# Patient Record
Sex: Female | Born: 1935 | Race: White | Hispanic: No | Marital: Married | Smoking: Never smoker
Health system: Southern US, Community
[De-identification: ages and names within clinical notes are randomized; demographics above are authoritative.]

## PROBLEM LIST (undated history)

## (undated) DIAGNOSIS — D649 Anemia, unspecified: Secondary | ICD-10-CM

## (undated) DIAGNOSIS — I8390 Asymptomatic varicose veins of unspecified lower extremity: Secondary | ICD-10-CM

## (undated) DIAGNOSIS — G629 Polyneuropathy, unspecified: Secondary | ICD-10-CM

## (undated) DIAGNOSIS — M1990 Unspecified osteoarthritis, unspecified site: Secondary | ICD-10-CM

## (undated) DIAGNOSIS — R519 Headache, unspecified: Secondary | ICD-10-CM

## (undated) DIAGNOSIS — E119 Type 2 diabetes mellitus without complications: Secondary | ICD-10-CM

## (undated) DIAGNOSIS — R51 Headache: Secondary | ICD-10-CM

## (undated) DIAGNOSIS — I10 Essential (primary) hypertension: Secondary | ICD-10-CM

## (undated) HISTORY — PX: TONSILLECTOMY: SUR1361

## (undated) HISTORY — PX: TYMPANOPLASTY: SHX33

## (undated) HISTORY — PX: ABDOMINAL HYSTERECTOMY: SHX81

## (undated) HISTORY — PX: EYE SURGERY: SHX253

---

## 2008-01-08 ENCOUNTER — Encounter: Admission: RE | Admit: 2008-01-08 | Discharge: 2008-01-08 | Payer: Self-pay | Admitting: Specialist

## 2008-09-27 ENCOUNTER — Ambulatory Visit: Payer: Self-pay | Admitting: Diagnostic Radiology

## 2008-09-27 ENCOUNTER — Ambulatory Visit: Admission: RE | Admit: 2008-09-27 | Discharge: 2008-09-27 | Payer: Self-pay | Admitting: Family Medicine

## 2009-10-18 ENCOUNTER — Emergency Department (HOSPITAL_BASED_OUTPATIENT_CLINIC_OR_DEPARTMENT_OTHER): Admission: EM | Admit: 2009-10-18 | Discharge: 2009-10-18 | Payer: Self-pay | Admitting: Emergency Medicine

## 2009-10-18 ENCOUNTER — Ambulatory Visit: Payer: Self-pay | Admitting: Radiology

## 2010-08-12 LAB — URINE CULTURE: Culture: NO GROWTH

## 2010-08-12 LAB — COMPREHENSIVE METABOLIC PANEL
AST: 85 U/L — ABNORMAL HIGH (ref 0–37)
Albumin: 3.7 g/dL (ref 3.5–5.2)
Alkaline Phosphatase: 84 U/L (ref 39–117)
BUN: 37 mg/dL — ABNORMAL HIGH (ref 6–23)
Chloride: 106 mEq/L (ref 96–112)
Creatinine, Ser: 1.8 mg/dL — ABNORMAL HIGH (ref 0.4–1.2)
Glucose, Bld: 196 mg/dL — ABNORMAL HIGH (ref 70–99)
Potassium: 4.2 mEq/L (ref 3.5–5.1)

## 2010-08-12 LAB — CBC
Hemoglobin: 9.2 g/dL — ABNORMAL LOW (ref 12.0–15.0)
MCHC: 34.5 g/dL (ref 30.0–36.0)
Platelets: 131 10*3/uL — ABNORMAL LOW (ref 150–400)
WBC: 2.1 10*3/uL — ABNORMAL LOW (ref 4.0–10.5)

## 2010-08-12 LAB — POCT CARDIAC MARKERS
CKMB, poc: <1 ng/mL — ABNORMAL LOW (ref 1.0–8.0)
Myoglobin, poc: 88.9 ng/mL (ref 12–200)
Troponin i, poc: <0.05 ng/mL (ref 0.00–0.09)

## 2010-08-12 LAB — DIFFERENTIAL
Basophils Absolute: 0 10*3/uL (ref 0.0–0.1)
Basophils Relative: 1 % (ref 0–1)
Eosinophils Relative: 1 % (ref 0–5)
Lymphocytes Relative: 33 % (ref 12–46)

## 2010-08-12 LAB — URINALYSIS, ROUTINE W REFLEX MICROSCOPIC
Bilirubin Urine: NEGATIVE
Glucose, UA: NEGATIVE mg/dL
Ketones, ur: NEGATIVE mg/dL
Specific Gravity, Urine: 1.015 (ref 1.005–1.030)
pH: 5 (ref 5.0–8.0)

## 2015-03-01 ENCOUNTER — Encounter (HOSPITAL_BASED_OUTPATIENT_CLINIC_OR_DEPARTMENT_OTHER): Payer: Self-pay | Admitting: *Deleted

## 2015-03-01 ENCOUNTER — Other Ambulatory Visit: Payer: Self-pay | Admitting: Orthopedic Surgery

## 2015-03-02 ENCOUNTER — Encounter (HOSPITAL_BASED_OUTPATIENT_CLINIC_OR_DEPARTMENT_OTHER)
Admission: RE | Admit: 2015-03-02 | Discharge: 2015-03-02 | Disposition: A | Discharge status: Home / Self Care | Payer: Medicare Other | Source: Ambulatory Visit | Attending: Orthopedic Surgery | Admitting: Orthopedic Surgery

## 2015-03-02 ENCOUNTER — Other Ambulatory Visit: Payer: Self-pay

## 2015-03-02 DIAGNOSIS — Z01818 Encounter for other preprocedural examination: Secondary | ICD-10-CM | POA: Insufficient documentation

## 2015-03-02 DIAGNOSIS — Z794 Long term (current) use of insulin: Secondary | ICD-10-CM | POA: Diagnosis not present

## 2015-03-02 DIAGNOSIS — E1140 Type 2 diabetes mellitus with diabetic neuropathy, unspecified: Secondary | ICD-10-CM | POA: Diagnosis not present

## 2015-03-02 DIAGNOSIS — M720 Palmar fascial fibromatosis [Dupuytren]: Secondary | ICD-10-CM | POA: Insufficient documentation

## 2015-03-02 DIAGNOSIS — Z79899 Other long term (current) drug therapy: Secondary | ICD-10-CM | POA: Diagnosis not present

## 2015-03-02 DIAGNOSIS — Z7984 Long term (current) use of oral hypoglycemic drugs: Secondary | ICD-10-CM | POA: Diagnosis not present

## 2015-03-02 DIAGNOSIS — M1990 Unspecified osteoarthritis, unspecified site: Secondary | ICD-10-CM | POA: Diagnosis not present

## 2015-03-02 DIAGNOSIS — I10 Essential (primary) hypertension: Secondary | ICD-10-CM | POA: Diagnosis not present

## 2015-03-02 LAB — CBC
HEMATOCRIT: 32.4 % — AB (ref 36.0–46.0)
HEMOGLOBIN: 10.7 g/dL — AB (ref 12.0–15.0)
MCH: 29.3 pg (ref 26.0–34.0)
MCHC: 33 g/dL (ref 30.0–36.0)
MCV: 88.8 fL (ref 78.0–100.0)
Platelets: 199 10*3/uL (ref 150–400)
RBC: 3.65 MIL/uL — ABNORMAL LOW (ref 3.87–5.11)
RDW: 13.6 % (ref 11.5–15.5)
WBC: 7 10*3/uL (ref 4.0–10.5)

## 2015-03-02 LAB — BASIC METABOLIC PANEL
ANION GAP: 8 (ref 5–15)
BUN: 25 mg/dL — AB (ref 6–20)
CHLORIDE: 105 mmol/L (ref 101–111)
CO2: 26 mmol/L (ref 22–32)
Calcium: 9.5 mg/dL (ref 8.9–10.3)
Creatinine, Ser: 1.23 mg/dL — ABNORMAL HIGH (ref 0.44–1.00)
GFR calc Af Amer: 45 mL/min — ABNORMAL LOW (ref >60)
GFR calc non Af Amer: 39 mL/min — ABNORMAL LOW (ref >60)
GLUCOSE: 192 mg/dL — AB (ref 65–99)
POTASSIUM: 3.7 mmol/L (ref 3.5–5.1)
SODIUM: 139 mmol/L (ref 135–145)

## 2015-03-02 NOTE — Progress Notes (Signed)
Dr Okey Dupre anesthesia consult   Clear for surgery.  Pt ankles have been swelling lt more than right,  Hx of sob for months no change. Pt will see primary care for swelling after surgery

## 2015-03-02 NOTE — Progress Notes (Signed)
BP 146/69  On Pat visit

## 2015-03-08 ENCOUNTER — Ambulatory Visit (HOSPITAL_BASED_OUTPATIENT_CLINIC_OR_DEPARTMENT_OTHER): Payer: Medicare Other | Admitting: Anesthesiology

## 2015-03-08 ENCOUNTER — Encounter (HOSPITAL_BASED_OUTPATIENT_CLINIC_OR_DEPARTMENT_OTHER)
Admission: RE | Disposition: A | Discharge status: Home / Self Care | Payer: Self-pay | Source: Ambulatory Visit | Attending: Orthopedic Surgery

## 2015-03-08 ENCOUNTER — Ambulatory Visit (HOSPITAL_BASED_OUTPATIENT_CLINIC_OR_DEPARTMENT_OTHER)
Admission: RE | Admit: 2015-03-08 | Discharge: 2015-03-08 | Disposition: A | Discharge status: Home / Self Care | Payer: Medicare Other | Source: Ambulatory Visit | Attending: Orthopedic Surgery | Admitting: Orthopedic Surgery

## 2015-03-08 ENCOUNTER — Encounter (HOSPITAL_BASED_OUTPATIENT_CLINIC_OR_DEPARTMENT_OTHER): Payer: Self-pay | Admitting: Anesthesiology

## 2015-03-08 DIAGNOSIS — Z79899 Other long term (current) drug therapy: Secondary | ICD-10-CM | POA: Insufficient documentation

## 2015-03-08 DIAGNOSIS — M720 Palmar fascial fibromatosis [Dupuytren]: Secondary | ICD-10-CM | POA: Diagnosis not present

## 2015-03-08 DIAGNOSIS — I10 Essential (primary) hypertension: Secondary | ICD-10-CM | POA: Insufficient documentation

## 2015-03-08 DIAGNOSIS — M1990 Unspecified osteoarthritis, unspecified site: Secondary | ICD-10-CM | POA: Insufficient documentation

## 2015-03-08 DIAGNOSIS — E1140 Type 2 diabetes mellitus with diabetic neuropathy, unspecified: Secondary | ICD-10-CM | POA: Diagnosis not present

## 2015-03-08 DIAGNOSIS — Z7984 Long term (current) use of oral hypoglycemic drugs: Secondary | ICD-10-CM | POA: Insufficient documentation

## 2015-03-08 DIAGNOSIS — Z794 Long term (current) use of insulin: Secondary | ICD-10-CM | POA: Insufficient documentation

## 2015-03-08 HISTORY — DX: Essential (primary) hypertension: I10

## 2015-03-08 HISTORY — DX: Type 2 diabetes mellitus without complications: E11.9

## 2015-03-08 HISTORY — PX: FASCIOTOMY: SHX132

## 2015-03-08 HISTORY — DX: Unspecified osteoarthritis, unspecified site: M19.90

## 2015-03-08 HISTORY — DX: Polyneuropathy, unspecified: G62.9

## 2015-03-08 HISTORY — DX: Headache, unspecified: R51.9

## 2015-03-08 HISTORY — DX: Asymptomatic varicose veins of unspecified lower extremity: I83.90

## 2015-03-08 HISTORY — DX: Anemia, unspecified: D64.9

## 2015-03-08 HISTORY — DX: Headache: R51

## 2015-03-08 LAB — GLUCOSE, CAPILLARY
GLUCOSE-CAPILLARY: 130 mg/dL — AB (ref 65–99)
Glucose-Capillary: 130 mg/dL — ABNORMAL HIGH (ref 65–99)

## 2015-03-08 SURGERY — FASCIOTOMY, UPPER EXTREMITY
Anesthesia: Monitor Anesthesia Care | Site: Hand | Laterality: Left

## 2015-03-08 MED ORDER — CEFAZOLIN SODIUM-DEXTROSE 2-3 GM-% IV SOLR
INTRAVENOUS | Status: AC
  Filled 2015-03-08: qty 50

## 2015-03-08 MED ORDER — PROPOFOL 10 MG/ML IV BOLUS
INTRAVENOUS | Status: DC | PRN
  Administered 2015-03-08 (×2): 20 mg via INTRAVENOUS
  Administered 2015-03-08: 10 mg via INTRAVENOUS

## 2015-03-08 MED ORDER — SCOPOLAMINE 1 MG/3DAYS TD PT72: 1.0000 | MEDICATED_PATCH | Freq: Once | TRANSDERMAL | Status: DC | PRN

## 2015-03-08 MED ORDER — BUPIVACAINE HCL (PF) 0.25 % IJ SOLN
INTRAMUSCULAR | Status: DC | PRN
  Administered 2015-03-08: 8 mL

## 2015-03-08 MED ORDER — OXYCODONE HCL 5 MG PO TABS: 5.0000 mg | ORAL_TABLET | Freq: Once | ORAL | Status: DC | PRN

## 2015-03-08 MED ORDER — HYDROCODONE-ACETAMINOPHEN 5-325 MG PO TABS: 1.0000 | ORAL_TABLET | Freq: Four times a day (QID) | ORAL | Status: DC | PRN

## 2015-03-08 MED ORDER — BUPIVACAINE HCL (PF) 0.25 % IJ SOLN
INTRAMUSCULAR | Status: AC
  Filled 2015-03-08: qty 180

## 2015-03-08 MED ORDER — FENTANYL CITRATE (PF) 100 MCG/2ML IJ SOLN: 25.0000 ug | INTRAMUSCULAR | Status: DC | PRN

## 2015-03-08 MED ORDER — ONDANSETRON HCL 4 MG/2ML IJ SOLN
INTRAMUSCULAR | Status: DC | PRN
  Administered 2015-03-08: 4 mg via INTRAVENOUS

## 2015-03-08 MED ORDER — FENTANYL CITRATE (PF) 100 MCG/2ML IJ SOLN
INTRAMUSCULAR | Status: AC
  Filled 2015-03-08: qty 4

## 2015-03-08 MED ORDER — CEFAZOLIN SODIUM-DEXTROSE 2-3 GM-% IV SOLR: 2.0000 g | INTRAVENOUS | Status: DC

## 2015-03-08 MED ORDER — CHLORHEXIDINE GLUCONATE 4 % EX LIQD: 60.0000 mL | Freq: Once | CUTANEOUS | Status: DC

## 2015-03-08 MED ORDER — FENTANYL CITRATE (PF) 100 MCG/2ML IJ SOLN
INTRAMUSCULAR | Status: DC | PRN
  Administered 2015-03-08: 100 ug via INTRAVENOUS

## 2015-03-08 MED ORDER — ONDANSETRON HCL 4 MG/2ML IJ SOLN: 4.0000 mg | Freq: Once | INTRAMUSCULAR | Status: DC | PRN

## 2015-03-08 MED ORDER — CEFAZOLIN SODIUM-DEXTROSE 2-3 GM-% IV SOLR
2.0000 g | INTRAVENOUS | Status: AC
  Administered 2015-03-08: 2 g via INTRAVENOUS

## 2015-03-08 MED ORDER — PROPOFOL 500 MG/50ML IV EMUL
INTRAVENOUS | Status: AC
  Filled 2015-03-08: qty 50

## 2015-03-08 MED ORDER — LACTATED RINGERS IV SOLN
INTRAVENOUS | Status: DC
Start: 2015-03-08 — End: 2015-03-08
  Administered 2015-03-08: 07:00:00 via INTRAVENOUS

## 2015-03-08 MED ORDER — ONDANSETRON HCL 4 MG/2ML IJ SOLN
INTRAMUSCULAR | Status: AC
  Filled 2015-03-08: qty 2

## 2015-03-08 MED ORDER — OXYCODONE HCL 5 MG/5ML PO SOLN: 5.0000 mg | Freq: Once | ORAL | Status: DC | PRN

## 2015-03-08 MED ORDER — GLYCOPYRROLATE 0.2 MG/ML IJ SOLN: 0.2000 mg | Freq: Once | INTRAMUSCULAR | Status: DC | PRN

## 2015-03-08 MED ORDER — LIDOCAINE HCL (PF) 0.5 % IJ SOLN
INTRAMUSCULAR | Status: DC | PRN
  Administered 2015-03-08: 30 mL via INTRAVENOUS

## 2015-03-08 MED ORDER — LIDOCAINE HCL (PF) 1 % IJ SOLN
INTRAMUSCULAR | Status: AC
  Filled 2015-03-08: qty 5

## 2015-03-08 SURGICAL SUPPLY — 44 items
BLADE MINI RND TIP GREEN BEAV (BLADE) ×3 IMPLANT
BLADE SURG 15 STRL LF DISP TIS (BLADE) ×1 IMPLANT
BLADE SURG 15 STRL SS (BLADE) ×2
BNDG COHESIVE 3X5 TAN STRL LF (GAUZE/BANDAGES/DRESSINGS) ×3 IMPLANT
BNDG ESMARK 4X9 LF (GAUZE/BANDAGES/DRESSINGS) ×3 IMPLANT
BNDG GAUZE ELAST 4 BULKY (GAUZE/BANDAGES/DRESSINGS) ×3 IMPLANT
CHLORAPREP W/TINT 26ML (MISCELLANEOUS) ×3 IMPLANT
CORDS BIPOLAR (ELECTRODE) ×3 IMPLANT
COVER BACK TABLE 60X90IN (DRAPES) ×3 IMPLANT
COVER MAYO STAND STRL (DRAPES) ×3 IMPLANT
CUFF TOURNIQUET SINGLE 18IN (TOURNIQUET CUFF) ×3 IMPLANT
DECANTER SPIKE VIAL GLASS SM (MISCELLANEOUS) IMPLANT
DRAPE EXTREMITY T 121X128X90 (DRAPE) ×3 IMPLANT
DRAPE SURG 17X23 STRL (DRAPES) ×3 IMPLANT
DRSG PAD ABDOMINAL 8X10 ST (GAUZE/BANDAGES/DRESSINGS) IMPLANT
GAUZE SPONGE 4X4 12PLY STRL (GAUZE/BANDAGES/DRESSINGS) ×3 IMPLANT
GAUZE XEROFORM 1X8 LF (GAUZE/BANDAGES/DRESSINGS) ×3 IMPLANT
GLOVE BIOGEL PI IND STRL 7.0 (GLOVE) ×1 IMPLANT
GLOVE BIOGEL PI IND STRL 8.5 (GLOVE) ×1 IMPLANT
GLOVE BIOGEL PI INDICATOR 7.0 (GLOVE) ×2
GLOVE BIOGEL PI INDICATOR 8.5 (GLOVE) ×2
GLOVE ECLIPSE 6.5 STRL STRAW (GLOVE) ×3 IMPLANT
GLOVE SURG ORTHO 8.0 STRL STRW (GLOVE) ×3 IMPLANT
GOWN STRL REUS W/ TWL LRG LVL3 (GOWN DISPOSABLE) ×1 IMPLANT
GOWN STRL REUS W/TWL LRG LVL3 (GOWN DISPOSABLE) ×2
GOWN STRL REUS W/TWL XL LVL3 (GOWN DISPOSABLE) ×3 IMPLANT
NEEDLE PRECISIONGLIDE 27X1.5 (NEEDLE) ×3 IMPLANT
NS IRRIG 1000ML POUR BTL (IV SOLUTION) ×3 IMPLANT
PACK BASIN DAY SURGERY FS (CUSTOM PROCEDURE TRAY) ×3 IMPLANT
PAD CAST 3X4 CTTN HI CHSV (CAST SUPPLIES) IMPLANT
PADDING CAST ABS 4INX4YD NS (CAST SUPPLIES) ×2
PADDING CAST ABS COTTON 4X4 ST (CAST SUPPLIES) ×1 IMPLANT
PADDING CAST COTTON 3X4 STRL (CAST SUPPLIES)
SLEEVE SCD COMPRESS KNEE MED (MISCELLANEOUS) IMPLANT
SPLINT PLASTER CAST XFAST 3X15 (CAST SUPPLIES) ×10 IMPLANT
SPLINT PLASTER XTRA FASTSET 3X (CAST SUPPLIES) ×20
STOCKINETTE 4X48 STRL (DRAPES) ×3 IMPLANT
SUT ETHILON 4 0 PS 2 18 (SUTURE) ×3 IMPLANT
SUT ETHILON 5 0 PS 2 18 (SUTURE) ×3 IMPLANT
SUT SILK 4 0 PS 2 (SUTURE) IMPLANT
SYR BULB 3OZ (MISCELLANEOUS) ×3 IMPLANT
SYR CONTROL 10ML LL (SYRINGE) ×3 IMPLANT
TOWEL OR 17X24 6PK STRL BLUE (TOWEL DISPOSABLE) ×6 IMPLANT
UNDERPAD 30X30 (UNDERPADS AND DIAPERS) ×3 IMPLANT

## 2015-03-08 NOTE — Discharge Instructions (Addendum)

## 2015-03-08 NOTE — Anesthesia Postprocedure Evaluation (Signed)
  Anesthesia Post-op Note  Patient: Cheryl Marquez  Procedure(s) Performed: Procedure(s): FASCIOTOMY LEFT MALL, LEFT INDEX RADIA CORD (Left)  Patient Location: PACU  Anesthesia Type:General  Level of Consciousness: awake, alert  and oriented  Airway and Oxygen Therapy: Patient Spontanous Breathing and Patient connected to nasal cannula oxygen  Post-op Pain: mild  Post-op Assessment: Post-op Vital signs reviewed, Patient's Cardiovascular Status Stable, Respiratory Function Stable, Patent Airway and Pain level controlled              Post-op Vital Signs: stable  Last Vitals:  Filed Vitals:   03/08/15 1000  BP: 141/45  Pulse: 69  Temp: 36.5 C  Resp: 14    Complications: No apparent anesthesia complications

## 2015-03-08 NOTE — Op Note (Signed)
Dictation Number [id number]

## 2015-03-08 NOTE — Transfer of Care (Signed)
Immediate Anesthesia Transfer of Care Note  Patient: Cheryl Marquez  Procedure(s) Performed: Procedure(s): FASCIOTOMY LEFT MALL, LEFT INDEX RADIA CORD (Left)  Patient Location: PACU  Anesthesia Type:Bier block  Level of Consciousness: sedated  Airway & Oxygen Therapy: Patient Spontanous Breathing and Patient connected to face mask oxygen  Post-op Assessment: Report given to RN and Post -op Vital signs reviewed and stable  Post vital signs: Reviewed and stable  Last Vitals:  Filed Vitals:   03/08/15 0708  BP: 162/55  Pulse: 63  Temp: 36.5 C  Resp: 18    Complications: No apparent anesthesia complications

## 2015-03-08 NOTE — Brief Op Note (Signed)
03/08/2015  9:15 AM  PATIENT:  Cheryl Marquez  81 y.o. female  PRE-OPERATIVE DIAGNOSIS:  DUPUYTRENS CONTRACTURE LEFT SMALL INDEX  POST-OPERATIVE DIAGNOSIS:  DUPUYTRENS CONTRACTURE LEFT SMALL INDEX  PROCEDURE:  Procedure(s): FASCIOTOMY LEFT MALL, LEFT INDEX RADIA CORD (Left)  SURGEON:  Surgeon(s) and Role:    * Cindee SaltGary Sonnet Rizor, MD - Primary  PHYSICIAN ASSISTANT:   ASSISTANTS: none   ANESTHESIA:   local and regional  EBL:  Total I/O In: 200 [I.V.:200] Out: -   BLOOD ADMINISTERED:none  DRAINS: none   LOCAL MEDICATIONS USED:  BUPIVICAINE   SPECIMEN:  No Specimen  DISPOSITION OF SPECIMEN:  N/A  COUNTS:  YES  TOURNIQUET:   Total Tourniquet Time Documented: Forearm (Left) - 24 minutes Total: Forearm (Left) - 24 minutes   DICTATION: .Other Dictation: Dictation Number [id number]  PLAN OF CARE: Discharge to home after PACU  PATIENT DISPOSITION:  PACU - hemodynamically stable.

## 2015-03-08 NOTE — H&P (Signed)
Cheryl Marquez is an 81 year old right hand dominant female. She has a history of carpal tunnel syndrome and Dupuytren's contracture with a Xiaflex injection which gave her no significant change to the small finger. She now returns with beginning of some deformity to her index finger left hand and continued deformity to the small finger flexion in nature.  PAST MEDICAL HISTORY:  She has no drug allergies. She is on Valsartan, Amlodipine, Glipizide, atorvastatin, Humalog, Lantus, and vitamin D. She relates no significant surgery.   FAMILY MEDICAL HISTORY: Positive for diabetes otherwise negative.  SOCIAL HISTORY:  She does not smoke or use alcohol.   REVIEW OF SYSTEMS: Positive for glasses, hearing loss, high BP, otherwise negative 14 points. Cheryl Marquez is an 81 y.o. female.   Chief Complaint: dupuytren's contracture left hand HPI: see above  Past Medical History  Diagnosis Date  . Diabetes mellitus without complication (HCC)   . Hypertension   . Anemia   . Arthritis   . Neuropathy (HCC)   . Varicose vein of leg   . Headache     Past Surgical History  Procedure Laterality Date  . Abdominal hysterectomy    . Tympanoplasty    . Eye surgery    . Tonsillectomy      History reviewed. No pertinent family history. Social History:  reports that she has never smoked. She has never used smokeless tobacco. She reports that she does not drink alcohol or use illicit drugs.  Allergies: No Known Allergies  Medications Prior to Admission  Medication Sig Dispense Refill  . amLODipine (NORVASC) 5 MG tablet Take 5 mg by mouth daily.    Marland Kitchen atorvastatin (LIPITOR) 40 MG tablet Take 40 mg by mouth daily.    Marland Kitchen glimepiride (AMARYL) 4 MG tablet Take 4 mg by mouth daily with breakfast.    . insulin glargine (LANTUS) 100 UNIT/ML injection Inject 50 Units into the skin at bedtime.    . insulin lispro (HUMALOG) 100 UNIT/ML injection Inject into the skin 3 (three) times daily before meals.    .  valsartan (DIOVAN) 320 MG tablet Take 320 mg by mouth daily.      No results found for this or any previous visit (from the past 48 hour(s)).  No results found.   Pertinent items are noted in HPI.  Blood pressure 162/55, pulse 63, temperature 97.7 F (36.5 C), temperature source Oral, resp. rate 18, height  (1.626 m), weight 66.906 kg (147 lb 8 oz), SpO2 100 %.  General appearance: alert, cooperative and appears stated age Head: Normocephalic, without obvious abnormality Neck: no JVD Resp: clear to auscultation bilaterally Cardio: regular rate and rhythm, S1, S2 normal, no murmur, click, rub or gallop GI: soft, non-tender; bowel sounds normal; no masses,  no organomegaly Extremities: contractures index and small fingers left hand Pulses: 2+ and symmetric Skin: Skin color, texture, turgor normal. No rashes or lesions Neurologic: Grossly normal Incision/Wound: na  Assessment/Plan X-rays reveal degenerative changes to the PIP and DIP joints of her fingers.  DIAGNOSIS: Degenerative arthritis asymptomatic other than stiffness with Dupuytren's cords to the index and small.   We have discussed with her the possibility of fasciotomies to each of the index and small finger primarily the small finger to improve her MCP joint motion without changing her PIP motion on the index finger to prevent the cord from further flexion. Pre, peri and post op care are discussed along with risks and complications. Patient is aware there is no guarantee with  surgery, possibility of infection, injury to arteries, nerves, and tendons, incomplete relief, continuation of the cords with some contracture and stiffness at the PIP joint and dystrophy. I would not recommend doing anything to the carpal tunnels at this time in that she is not complaining of numbness or tingling. This will be scheduled as an outpatient for fasciotomies index and small fingers left hand.   Cheryl Marquez R 03/08/2015, 7:40 AM

## 2015-03-08 NOTE — Anesthesia Preprocedure Evaluation (Addendum)
Anesthesia Evaluation  Patient identified by MRN, date of birth, ID band Patient awake    Reviewed: Allergy & Precautions, NPO status , Patient's Chart, lab work & pertinent test results  Airway Mallampati: II  TM Distance: >3 FB Neck ROM: Full    Dental  (+) Teeth Intact, Dental Advisory Given   Pulmonary    breath sounds clear to auscultation       Cardiovascular hypertension,  Rhythm:Regular Rate:Normal     Neuro/Psych    GI/Hepatic   Endo/Other  diabetes  Renal/GU      Musculoskeletal   Abdominal   Peds  Hematology   Anesthesia Other Findings   Reproductive/Obstetrics                            Anesthesia Physical Anesthesia Plan  ASA: III  Anesthesia Plan: MAC   Post-op Pain Management:    Induction: Intravenous  Airway Management Planned: LMA  Additional Equipment:   Intra-op Plan:   Post-operative Plan:   Informed Consent: I have reviewed the patients History and Physical, chart, labs and discussed the procedure including the risks, benefits and alternatives for the proposed anesthesia with the patient or authorized representative who has indicated his/her understanding and acceptance.   Dental advisory given  Plan Discussed with: CRNA and Anesthesiologist  Anesthesia Plan Comments:        Anesthesia Quick Evaluation

## 2015-03-09 ENCOUNTER — Encounter (HOSPITAL_BASED_OUTPATIENT_CLINIC_OR_DEPARTMENT_OTHER): Payer: Self-pay | Admitting: Orthopedic Surgery

## 2015-03-09 NOTE — Op Note (Signed)
NAME:  Cheryl Marquez, Cheryl Marquez               ACCOUNT NO.:  [id number]  MEDICAL RECORD NO.:  [id number]  LOCATION:                                 FACILITY:  PHYSICIAN:  Cindee SaltGary Cleveland Yarbro, M.D.            DATE OF BIRTH:  DATE OF PROCEDURE:  03/08/2015 DATE OF DISCHARGE:                              OPERATIVE REPORT   PREOPERATIVE DIAGNOSIS:  Dupuytren contracture, left index, left small fingers.  POSTOPERATIVE DIAGNOSIS:  Dupuytren contracture, left index, left small fingers.  OPERATION:  Fasciotomies, left index, left small finger.  SURGEON:  Cindee SaltGary Fred Franzen, MD.  ANESTHESIA:  Forearm-based IV regional with local infiltration.  ANESTHESIOLOGIST:  Dr. Ivin Bootyrews.  HISTORY:  The patient is an 81 year old female with a history of a very significant contractures to the metacarpophalangeal joint.  PIP joint of her left small finger with approximately 90 degrees in each position. She has a radial cord on her index finger crossing the PIP joint from the first dorsal interosseous.  She is elected to undergo release of each with fasciotomy, not fasciectomy.  Pre, peri, and postoperative course have been discussed along with risks and complications.  She is aware there is no guarantee with the surgery, possibility of infection, recurrence of injury to arteries, nerves, tendons, incomplete relief of symptoms, and dystrophy.  In preoperative area, the patient is seen, the extremity marked by both patient and surgeon.  Antibiotic given.  PROCEDURE IN DETAIL:  The patient was brought to the operating room, where a forearm-based IV regional anesthetic was carried out without difficulty.  She was prepped using ChloraPrep, supine position with the left arm free.  A 3-minute dry time was allowed.  Time-out taken, confirming the patient and procedure.  A transverse incision was made over the proximal phalanx of the index finger, left hand radial side, carried down through subcutaneous tissue.  A very stout thick cord  was immediately identified.  With blunt and sharp dissection, the neurovascular bundle was identified dorsal to this, a protector placed and the fasciotomy performed.  This allowed the PIP joint to come fully straight.  A separate incision was then made over the mid palm, small finger, carried down through subcutaneous tissue.  The palmar fascia was identified.  A thick cord was present to the ring and small finger. This was transected, protecting the deep neurovascular bundles.  This allowed the metacarpophalangeal joint to come fully straight on the small finger as separate incision was then made over the proximal phalanx with a central and abductor digiti quinti cord.  The cord was identified.  Neurovascular bundle was found deep to this.  A protective retractor placed and cord transected allowing the PIP joint to come straight with a proximal 30-degree extensor lag.  The wounds were copiously irrigated with saline.  They could not be closed on the ulnar aspect of her hand.  The radial incision was partially closed with interrupted 5-0 nylon sutures.  A local infiltration with 0.25% bupivacaine without epinephrine was given, 8 mL was used total.  A sterile compressive dressing was applied. Deflation of the tourniquet, all fingers immediately pinked.  She was taken to the recovery room  for observation in satisfactory condition. She will be discharged home to return to the [facility] [place] in 1 week on Norco.          ______________________________ Cindee Salt, M.D.     GK/MEDQ  D:  03/08/2015  T:  03/09/2015  Job:  [id number]

## 2020-06-17 ENCOUNTER — Emergency Department (HOSPITAL_BASED_OUTPATIENT_CLINIC_OR_DEPARTMENT_OTHER)
Admission: EM | Admit: 2020-06-17 | Discharge: 2020-06-17 | Disposition: A | Discharge status: Home / Self Care | Payer: Medicare Other | Attending: Emergency Medicine | Admitting: Emergency Medicine

## 2020-06-17 ENCOUNTER — Encounter (HOSPITAL_BASED_OUTPATIENT_CLINIC_OR_DEPARTMENT_OTHER): Payer: Self-pay | Admitting: Emergency Medicine

## 2020-06-17 ENCOUNTER — Emergency Department (HOSPITAL_BASED_OUTPATIENT_CLINIC_OR_DEPARTMENT_OTHER): Payer: Medicare Other

## 2020-06-17 DIAGNOSIS — E119 Type 2 diabetes mellitus without complications: Secondary | ICD-10-CM | POA: Insufficient documentation

## 2020-06-17 DIAGNOSIS — Z794 Long term (current) use of insulin: Secondary | ICD-10-CM | POA: Diagnosis not present

## 2020-06-17 DIAGNOSIS — Z7984 Long term (current) use of oral hypoglycemic drugs: Secondary | ICD-10-CM | POA: Diagnosis not present

## 2020-06-17 DIAGNOSIS — E782 Mixed hyperlipidemia: Secondary | ICD-10-CM | POA: Insufficient documentation

## 2020-06-17 DIAGNOSIS — R079 Chest pain, unspecified: Secondary | ICD-10-CM | POA: Insufficient documentation

## 2020-06-17 DIAGNOSIS — L03113 Cellulitis of right upper limb: Secondary | ICD-10-CM | POA: Diagnosis not present

## 2020-06-17 DIAGNOSIS — M25531 Pain in right wrist: Secondary | ICD-10-CM | POA: Diagnosis present

## 2020-06-17 DIAGNOSIS — Z79899 Other long term (current) drug therapy: Secondary | ICD-10-CM | POA: Insufficient documentation

## 2020-06-17 DIAGNOSIS — I10 Essential (primary) hypertension: Secondary | ICD-10-CM | POA: Diagnosis not present

## 2020-06-17 LAB — CBC WITH DIFFERENTIAL/PLATELET
Abs Immature Granulocytes: 0.03 10*3/uL (ref 0.00–0.07)
Basophils Absolute: 0 10*3/uL (ref 0.0–0.1)
Basophils Relative: 0 %
Eosinophils Absolute: 0.1 10*3/uL (ref 0.0–0.5)
Eosinophils Relative: 1 %
HCT: 31.5 % — ABNORMAL LOW (ref 36.0–46.0)
Hemoglobin: 10.8 g/dL — ABNORMAL LOW (ref 12.0–15.0)
Immature Granulocytes: 0 %
Lymphocytes Relative: 16 %
Lymphs Abs: 1.7 10*3/uL (ref 0.7–4.0)
MCH: 30.5 pg (ref 26.0–34.0)
MCHC: 34.3 g/dL (ref 30.0–36.0)
MCV: 89 fL (ref 80.0–100.0)
Monocytes Absolute: 1.2 10*3/uL — ABNORMAL HIGH (ref 0.1–1.0)
Monocytes Relative: 11 %
Neutro Abs: 7.5 10*3/uL (ref 1.7–7.7)
Neutrophils Relative %: 72 %
Platelets: 203 10*3/uL (ref 150–400)
RBC: 3.54 MIL/uL — ABNORMAL LOW (ref 3.87–5.11)
RDW: 12.8 % (ref 11.5–15.5)
WBC: 10.4 10*3/uL (ref 4.0–10.5)
nRBC: 0 % (ref 0.0–0.2)

## 2020-06-17 LAB — COMPREHENSIVE METABOLIC PANEL
ALT: 11 U/L (ref 0–44)
AST: 14 U/L — ABNORMAL LOW (ref 15–41)
Albumin: 3.4 g/dL — ABNORMAL LOW (ref 3.5–5.0)
Alkaline Phosphatase: 72 U/L (ref 38–126)
Anion gap: 11 (ref 5–15)
BUN: 33 mg/dL — ABNORMAL HIGH (ref 8–23)
CO2: 23 mmol/L (ref 22–32)
Calcium: 9.1 mg/dL (ref 8.9–10.3)
Chloride: 107 mmol/L (ref 98–111)
Creatinine, Ser: 1.35 mg/dL — ABNORMAL HIGH (ref 0.44–1.00)
GFR, Estimated: 37 mL/min — ABNORMAL LOW (ref >60)
Glucose, Bld: 114 mg/dL — ABNORMAL HIGH (ref 70–99)
Potassium: 3.6 mmol/L (ref 3.5–5.1)
Sodium: 141 mmol/L (ref 135–145)
Total Bilirubin: 0.3 mg/dL (ref 0.3–1.2)
Total Protein: 6.9 g/dL (ref 6.5–8.1)

## 2020-06-17 LAB — C-REACTIVE PROTEIN: CRP: 6.6 mg/dL — ABNORMAL HIGH (ref <1.0)

## 2020-06-17 LAB — LACTIC ACID, PLASMA: Lactic Acid, Venous: 1.1 mmol/L (ref 0.5–1.9)

## 2020-06-17 LAB — SEDIMENTATION RATE: Sed Rate: 88 mm/hr — ABNORMAL HIGH (ref 0–22)

## 2020-06-17 LAB — TROPONIN I (HIGH SENSITIVITY)
Troponin I (High Sensitivity): 33 ng/L — ABNORMAL HIGH (ref <18)
Troponin I (High Sensitivity): 32 ng/L — ABNORMAL HIGH (ref <18)

## 2020-06-17 MED ORDER — CLINDAMYCIN PHOSPHATE 600 MG/50ML IV SOLN
600.0000 mg | Freq: Once | INTRAVENOUS | Status: AC
  Administered 2020-06-17: 600 mg via INTRAVENOUS
  Filled 2020-06-17: qty 50

## 2020-06-17 MED ORDER — DOXYCYCLINE HYCLATE 100 MG PO CAPS: 100.0000 mg | ORAL_CAPSULE | Freq: Two times a day (BID) | ORAL | 0 refills | Status: AC

## 2020-06-17 MED ORDER — FENTANYL CITRATE (PF) 100 MCG/2ML IJ SOLN
25.0000 ug | Freq: Once | INTRAMUSCULAR | Status: AC
  Administered 2020-06-17: 25 ug via INTRAVENOUS
  Filled 2020-06-17: qty 2

## 2020-06-17 MED ORDER — CLINDAMYCIN HCL 300 MG PO CAPS: 300.0000 mg | ORAL_CAPSULE | Freq: Three times a day (TID) | ORAL | 0 refills | Status: AC

## 2020-06-17 MED ORDER — HYDROCODONE-ACETAMINOPHEN 5-325 MG PO TABS
1.0000 | ORAL_TABLET | Freq: Once | ORAL | Status: AC
  Administered 2020-06-17: 1 via ORAL
  Filled 2020-06-17: qty 1

## 2020-06-17 MED ORDER — HYDROCODONE-ACETAMINOPHEN 5-325 MG PO TABS: 1.0000 | ORAL_TABLET | Freq: Four times a day (QID) | ORAL | 0 refills | Status: AC | PRN

## 2020-06-17 MED ORDER — DOXYCYCLINE HYCLATE 100 MG PO TABS
100.0000 mg | ORAL_TABLET | Freq: Once | ORAL | Status: AC
  Administered 2020-06-17: 100 mg via ORAL
  Filled 2020-06-17: qty 1

## 2020-06-17 NOTE — ED Notes (Signed)
CBG: 113 °

## 2020-06-17 NOTE — ED Triage Notes (Signed)
Pt arrives pov with family with c/o distal right arm pain and swelling. Redness and swelling noted. Pt denies shob, denies CP at this time. Pt endorses taking nitro last night for CP

## 2020-06-17 NOTE — ED Provider Notes (Signed)
[facility] EMERGENCY DEPARTMENT Provider Note   CSN: [id number] Arrival date & time: 06/17/20  1206     History No chief complaint on file.   Cheryl Marquez is a 87 y.o. female.  HPI      87yo female with history of DM, hypertension, nonrheumatic moderate aortic stenosis, mixed hyperlipidemia, under evaluation for chest discomfort with stressful events with Dr. Okey Regal presents with concern for right wrist pain and redness.   Thought she slept on it wrong with some pain Friday, however it seemed improved Saturday. Today woke up and pain and redness significantly worse.  Redness and swelling.  Pain severe 10/10.  Worse with movements. No hx of gout or septic arthritis. No hx of injury. Does report her cats frequently chew on her fingers and notes a small scratch on the medial part of her wrist-desn't remember specific event but that they do scratch her. They have been behaving normally.  They are outside cats and have had rabies shots and she thinks they are up to date but is not sure.    Denies fevers, nausea, vomiting.  Had an episode of chest pain last night when the pain in her wrist was severe-has been having intermittent CP for which she is seeing her Cardiologist. Reports taking 1 nitroglycerin and pain resolving last night nad not returning. She reports he is planning on doing a procedure (sounds to be heart cath based on pt description, notes discuss this possibility-unable to visualize scheduled date in wf system.)     Past Medical History:  Diagnosis Date  . Anemia   . Arthritis   . Diabetes mellitus without complication (HCC)   . Headache   . Hypertension   . Neuropathy   . Varicose vein of leg     There are no problems to display for this patient.   Past Surgical History:  Procedure Laterality Date  . ABDOMINAL HYSTERECTOMY    . EYE SURGERY    . FASCIOTOMY Left 03/08/2015   Procedure: FASCIOTOMY LEFT MALL, LEFT INDEX RADIA CORD;  Surgeon:  Cindee Salt, MD;  Location: [facility];  Service: Orthopedics;  Laterality: Left;  . TONSILLECTOMY    . TYMPANOPLASTY       OB History   No obstetric history on file.     History reviewed. No pertinent family history.  Social History   Tobacco Use  . Smoking status: Never Smoker  . Smokeless tobacco: Never Used  Substance Use Topics  . Alcohol use: No  . Drug use: No    Home Medications Prior to Admission medications   Medication Sig Start Date End Date Taking? Authorizing Provider  clindamycin (CLEOCIN) 300 MG capsule Take 1 capsule (300 mg total) by mouth 3 (three) times daily. 06/17/20  Yes Charlynne Pander, MD  doxycycline (VIBRAMYCIN) 100 MG capsule Take 1 capsule (100 mg total) by mouth 2 (two) times daily. One po bid x 7 days 06/17/20  Yes Charlynne Pander, MD  HYDROcodone-acetaminophen (NORCO/VICODIN) 5-325 MG tablet Take 1 tablet by mouth every 6 (six) hours as needed. 06/17/20  Yes Charlynne Pander, MD  amLODipine (NORVASC) 5 MG tablet Take 5 mg by mouth daily.    [provider]  atorvastatin (LIPITOR) 40 MG tablet Take 40 mg by mouth daily.    [provider]  glimepiride (AMARYL) 4 MG tablet Take 4 mg by mouth daily with breakfast.    [provider]  insulin glargine (LANTUS) 100 UNIT/ML injection  Inject 50 Units into the skin at bedtime.    [provider]  insulin lispro (HUMALOG) 100 UNIT/ML injection Inject into the skin 3 (three) times daily before meals.    [provider]  valsartan (DIOVAN) 320 MG tablet Take 320 mg by mouth daily.    [provider]    Allergies    Patient has no known allergies.  Review of Systems   Review of Systems  Constitutional: Negative for fever.  Respiratory: Negative for cough and shortness of breath.   Cardiovascular: Positive for chest pain (episode last night resolved after 1 nitro).  Gastrointestinal: Negative for nausea and vomiting.   Musculoskeletal: Positive for arthralgias. Negative for back pain and neck pain.  Skin: Positive for rash and wound.  Neurological: Negative for syncope and headaches.    Physical Exam Updated Vital Signs BP (!) 163/54 (BP Location: Right Arm)   Pulse 66   Temp 98.8 F (37.1 C) (Oral)   Resp 17   SpO2 94%   Physical Exam Vitals and nursing note reviewed.  Constitutional:      General: She is not in acute distress.    Appearance: Normal appearance. She is not ill-appearing, toxic-appearing or diaphoretic.  HENT:     Head: Normocephalic.  Eyes:     Conjunctiva/sclera: Conjunctivae normal.  Cardiovascular:     Rate and Rhythm: Normal rate and regular rhythm.     Pulses: Normal pulses.  Pulmonary:     Effort: Pulmonary effort is normal. No respiratory distress.  Musculoskeletal:        General: Tenderness (right wrist, able to flex and extend but reports pain) present. No deformity or signs of injury.     Cervical back: No rigidity.  Skin:    General: Skin is warm and dry.     Coloration: Skin is not jaundiced or pale.     Findings: Erythema (to wrist, see photo) present. Lesion: tiy abrasion/scratch possible bite radial side of hand/wrist.  Neurological:     General: No focal deficit present.     Mental Status: She is alert and oriented to person, place, and time.         ED Results / Procedures / Treatments   Labs (all labs ordered are listed, but only abnormal results are displayed) Labs Reviewed  COMPREHENSIVE METABOLIC PANEL - Abnormal; Notable for the following components:      Result Value   Glucose, Bld 114 (*)    BUN 33 (*)    Creatinine, Ser 1.35 (*)    Albumin 3.4 (*)    AST 14 (*)    GFR, Estimated 37 (*)    All other components within normal limits  CBC WITH DIFFERENTIAL/PLATELET - Abnormal; Notable for the following components:   RBC 3.54 (*)    Hemoglobin 10.8 (*)    HCT 31.5 (*)    Monocytes Absolute 1.2 (*)    All other components within  normal limits  SEDIMENTATION RATE - Abnormal; Notable for the following components:   Sed Rate 88 (*)    All other components within normal limits  C-REACTIVE PROTEIN - Abnormal; Notable for the following components:   CRP 6.6 (*)    All other components within normal limits  TROPONIN I (HIGH SENSITIVITY) - Abnormal; Notable for the following components:   Troponin I (High Sensitivity) 33 (*)    All other components within normal limits  TROPONIN I (HIGH SENSITIVITY) - Abnormal; Notable for the following components:   Troponin I (  High Sensitivity) 32 (*)    All other components within normal limits  CULTURE, BLOOD (ROUTINE X 2)  CULTURE, BLOOD (ROUTINE X 2)  LACTIC ACID, PLASMA  LACTIC ACID, PLASMA  URINALYSIS, ROUTINE W REFLEX MICROSCOPIC  CBG MONITORING, ED    EKG EKG Interpretation  Date/Time:  Sunday June 17 2020 12:42:03 EST Ventricular Rate:  66 PR Interval:  188 QRS Duration: 90 QT Interval:  402 QTC Calculation: 421 R Axis:   17 Text Interpretation: Normal sinus rhythm Minimal voltage criteria for LVH, may be normal variant ( Cornell product ) Nonspecific ST and T wave abnormality Abnormal ECG No significant change since last tracing Confirmed by Juel Bellerose ([id number]) on 06/17/2020 2:10:54 PM   Radiology DG Wrist Complete Right  Result Date: 06/17/2020 CLINICAL DATA:  Pain, redness/swelling x3 days EXAM: RIGHT WRIST - COMPLETE 3+ VIEW COMPARISON:  None. FINDINGS: No fracture or dislocation is seen. Mild chondrocalcinosis along the TFCC. Mild degenerative changes with radiocarpal narrowing. No marginal erosions. Mild soft tissue swelling along the wrist. IMPRESSION: No fracture or dislocation is seen. Mild degenerative changes with chondrocalcinosis and soft tissue swelling. Electronically Signed   By: Sriyesh  Krishnan M.D.   On: 06/17/2020 14:52    Procedures Procedures (including critical care time)  Medications Ordered in ED Medications  clindamycin  (CLEOCIN) IVPB 600 mg (0 mg Intravenous Stopped 06/17/20 1735)  HYDROcodone-acetaminophen (NORCO/VICODIN) 5-325 MG per tablet 1 tablet (1 tablet Oral Given 06/17/20 1532)  doxycycline (VIBRA-TABS) tablet 100 mg (100 mg Oral Given 06/17/20 1537)  fentaNYL (SUBLIMAZE) injection 25 mcg (25 mcg Intravenous Given 06/17/20 1734)    ED Course  I have reviewed the triage vital signs and the nursing notes.  Pertinent labs & imaging results that were available during my care of the patient were reviewed by me and considered in my medical decision making (see chart for details).    MDM Rules/Calculators/A&P                          87 yo female with history of DM, hypertension, nonrheumatic moderate aortic stenosis, mixed hyperlipidemia, under evaluation for chest discomfort with stressful events with Dr. presents with concern for right wrist pain and redness.  Has erythema and warmth to area surrounding wrist, small area of scratch to right radial wrist.  No sign of fluctuance or abscess. Able to range wrist, no fever, no leukocytosis.   She reports she saw an orthopedic physician many years ago but does not remember name of physician or group.  Discussed with on call orthopedist that clinically I have a higher suspicion for cellulitis than septic arthritis however it is difficult to exclude septic arthritis as a possibility. She has no signs of sepsis.  Given higher suspicion for cellulitis at this time, no signs of sepsis, do not feel emergent transfer for Ortho eval/MRI or arthrocentesis (particularly with overlying cellulitis) is indicated.  Dr. Okey Regal recommends she call office for close follow up this week. Plan to send home on clindamycin and doxycycline for possible cat bite/MRSA coverage if troponin unchanged on recheck.  Had reported episode of CP last night when pain was severe.  Took nitro and it resolved--has been seeing her Cardiologist for similar CP under stress. Given it  resolved with nitro and has not recurred and that she has seen cardiology for this and is undergoing close outpt work up do not feel admission indicated for the short episode of CP if she has  unchanged troponin on repeat.  Signed out to Dr. Silverio Lay with repeat troponin pending.      Final Clinical Impression(s) / ED Diagnoses Final diagnoses:  Cellulitis of right upper extremity  Chest pain, unspecified type    Rx / DC Orders ED Discharge Orders         Ordered    clindamycin (CLEOCIN) 300 MG capsule  3 times daily        06/17/20 1721    doxycycline (VIBRAMYCIN) 100 MG capsule  2 times daily        06/17/20 1721    HYDROcodone-acetaminophen (NORCO/VICODIN) 5-325 MG tablet  Every 6 hours PRN        06/17/20 1721           Alvira Monday, MD 06/17/20 2238

## 2020-06-17 NOTE — Discharge Instructions (Addendum)
Take doxycycline and clindamycin for cellulitis   Take vicodin for severe pain   See hand surgery for follow up in 2-3 days   Return to ER if you have worse wrist swelling and pain and redness, fever, chest pain

## 2020-06-17 NOTE — ED Notes (Signed)
ED Provider at bedside. 

## 2020-06-17 NOTE — ED Provider Notes (Signed)
  Physical Exam  BP (!) 163/54   Pulse 64   Temp 98.4 F (36.9 C) (Oral)   Resp 18   SpO2 96%   Physical Exam  ED Course/Procedures     Procedures  MDM  Care assumed at 3 PM from Dr. Dalene Seltzer.  Patient has chest pain after she had the cat scratch yesterday.  Dr. Dalene Seltzer consulted hand surgery and patient will be discharged home with clindamycin and doxycycline.  Signout pending second troponin.   5:19 PM Finger troponin stable at 32.  Patient will be followed up with Dr. Eulah Pont outpatient.  Will discharge home with some pain medicine as well.      Charlynne Pander, MD 06/17/20 [phone]

## 2020-06-18 LAB — CULTURE, BLOOD (ROUTINE X 2): Culture: NO GROWTH

## 2020-06-19 LAB — CBG MONITORING, ED: Glucose-Capillary: 113 mg/dL — ABNORMAL HIGH (ref 70–99)

## 2020-06-20 LAB — CULTURE, BLOOD (ROUTINE X 2): Special Requests: ADEQUATE

## 2020-06-21 LAB — CULTURE, BLOOD (ROUTINE X 2)

## 2020-06-22 LAB — CULTURE, BLOOD (ROUTINE X 2)
Culture: NO GROWTH
Special Requests: ADEQUATE

## 2020-07-24 DEATH — deceased

## 2021-06-11 IMAGING — DX DG WRIST COMPLETE 3+V*R*
4 series · 4 of 4 positions shown · non-contrast
Comparison: None.

CLINICAL DATA: Pain, redness/swelling x3 days

EXAM:
RIGHT WRIST - COMPLETE 3+ VIEW

[wrist ap (1 of 2)]
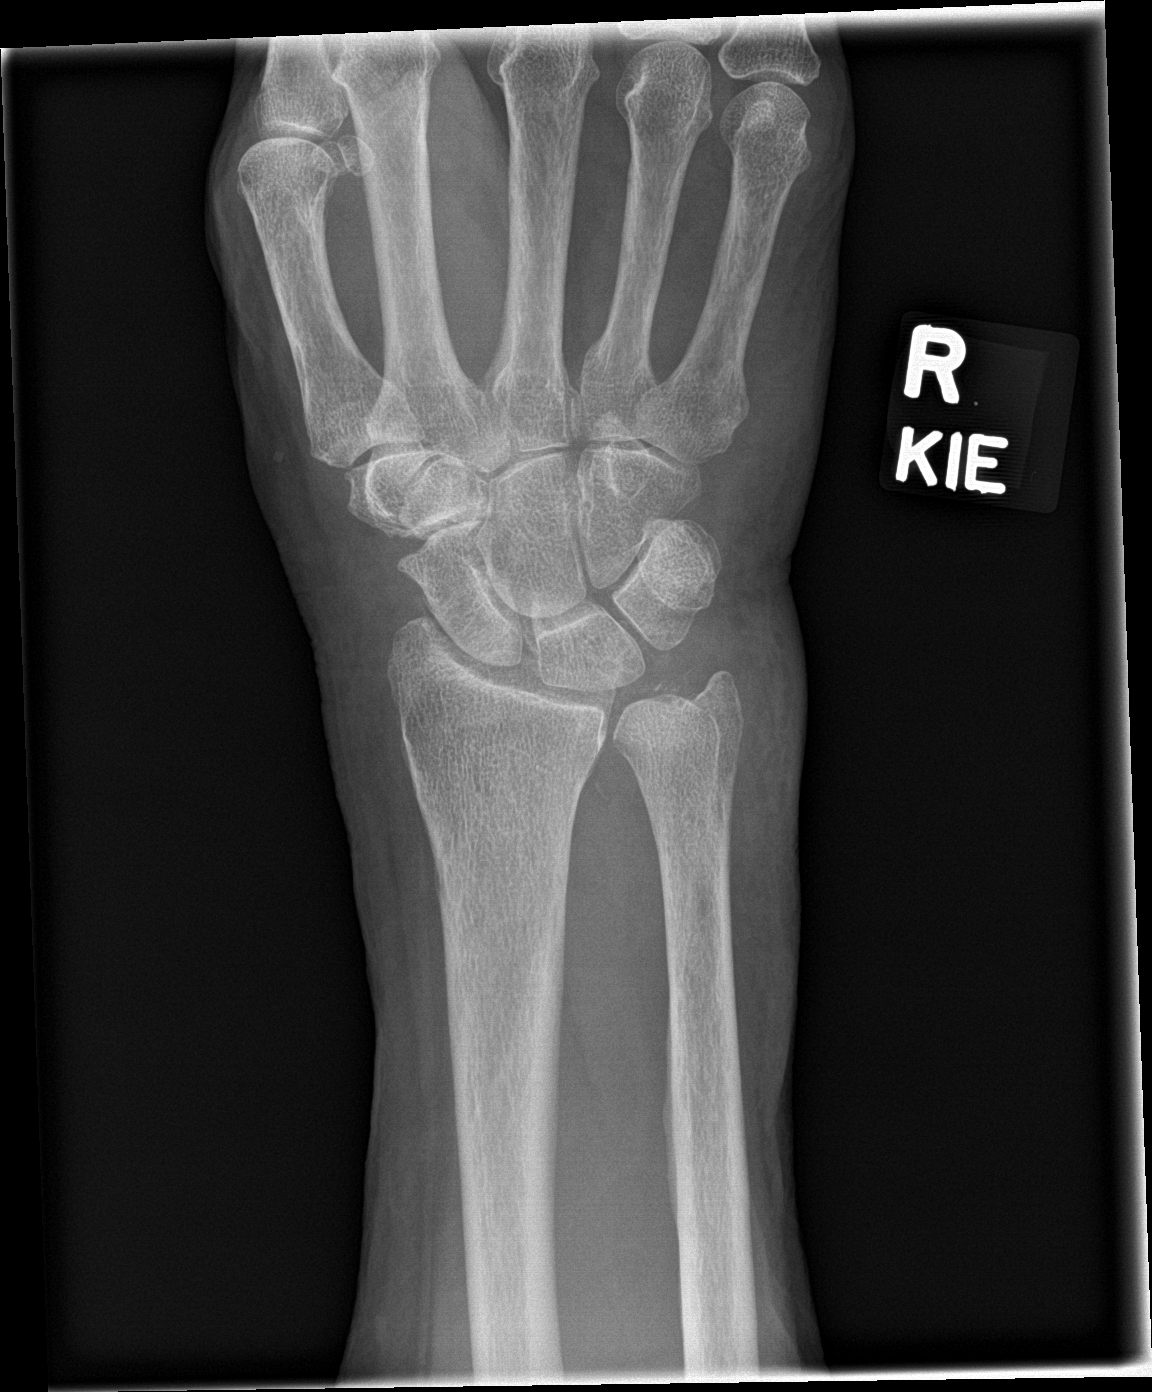

[wrist obl]
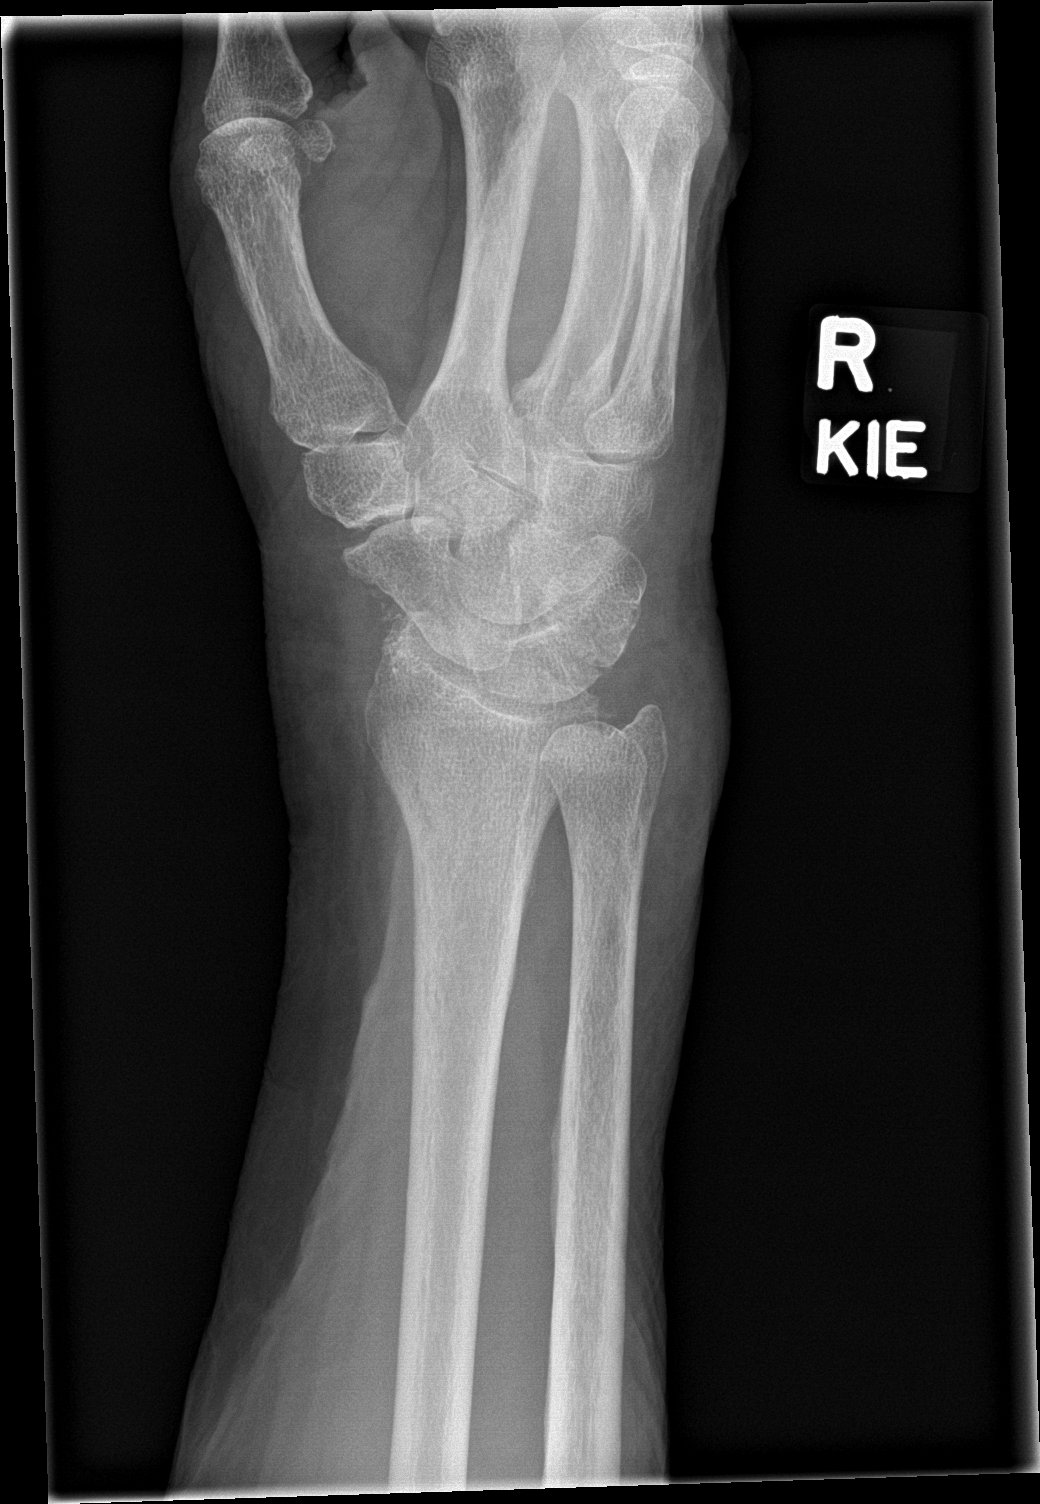

[wrist lat]
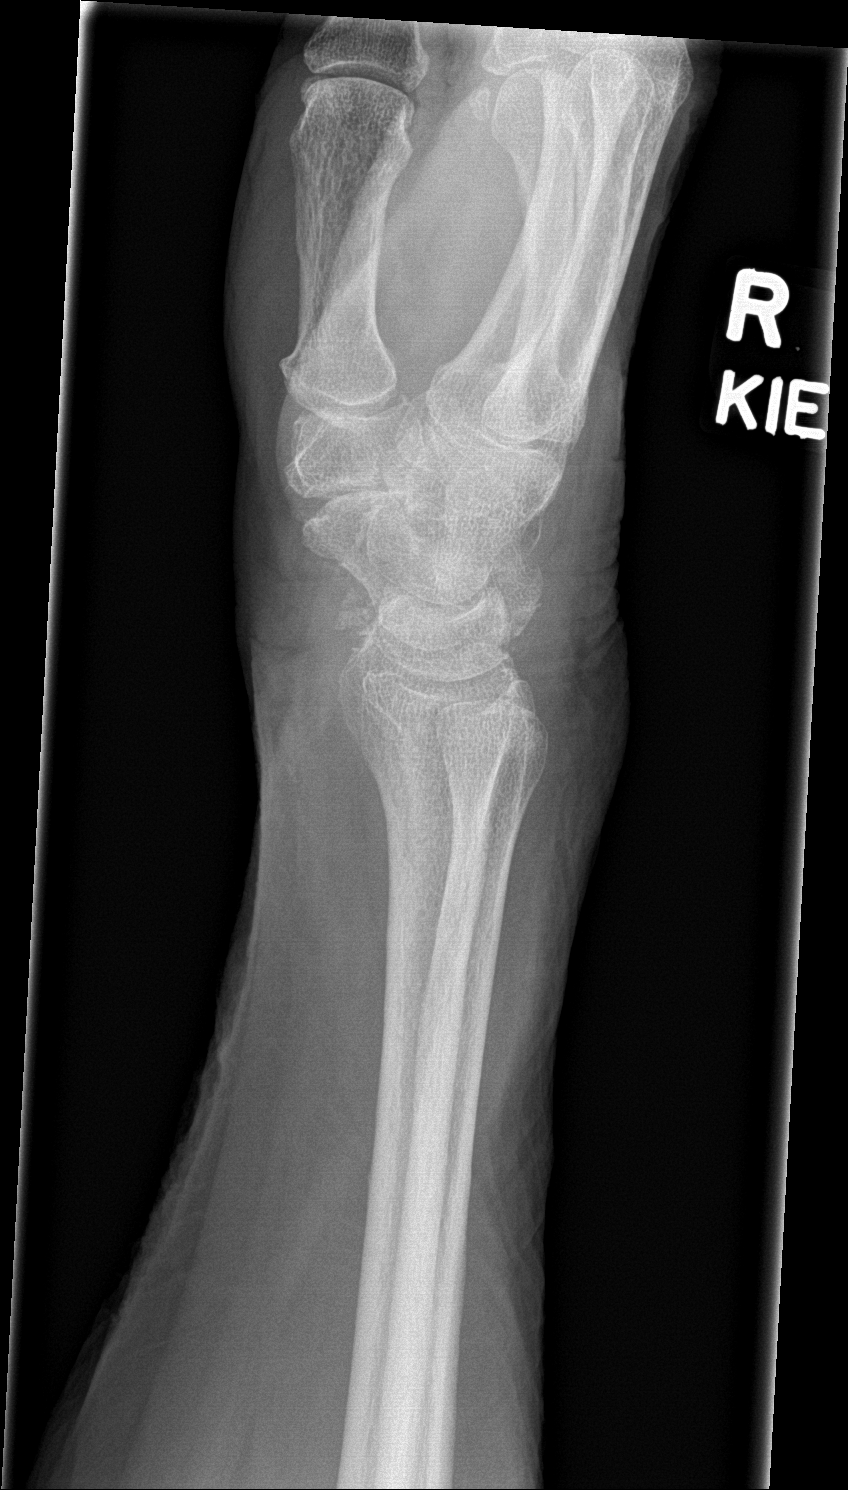

[wrist ap (2 of 2)]
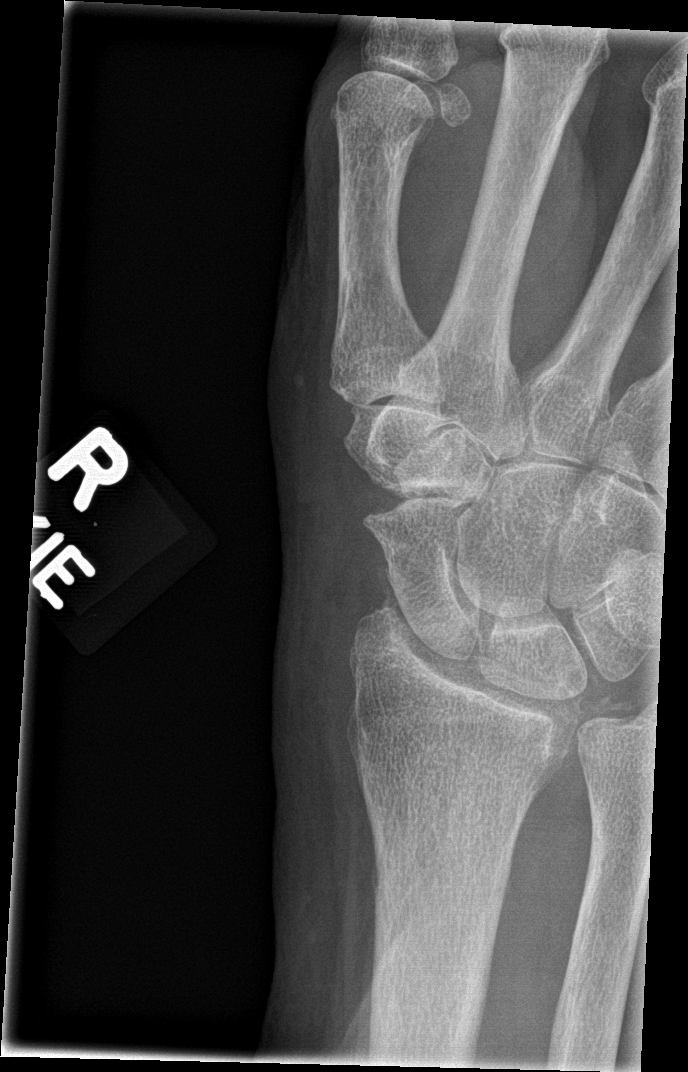

[4 of 4 positions shown; findings below may reference images not displayed]

FINDINGS: No fracture or dislocation is seen.

Mild chondrocalcinosis along the TFCC.

Mild degenerative changes with radiocarpal narrowing.

No marginal erosions.

Mild soft tissue swelling along the wrist.
IMPRESSION: No fracture or dislocation is seen.

Mild degenerative changes with chondrocalcinosis and soft tissue
swelling.
